# Patient Record
Sex: Male | Born: 2000 | Race: White | Hispanic: No | Marital: Single | Smoking: Never smoker
Health system: Southern US, Community
[De-identification: ages and names within clinical notes are randomized; demographics above are authoritative.]

## PROBLEM LIST (undated history)

## (undated) DIAGNOSIS — G259 Extrapyramidal and movement disorder, unspecified: Secondary | ICD-10-CM

## (undated) HISTORY — DX: Extrapyramidal and movement disorder, unspecified: G25.9

---
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
## 2023-04-11 ENCOUNTER — Encounter (INDEPENDENT_AMBULATORY_CARE_PROVIDER_SITE_OTHER): Payer: Self-pay
